# Patient Record
Sex: Male | Born: 1945 | Race: White | Hispanic: No | Marital: Married | Smoking: Former smoker
Health system: Southern US, Community
[De-identification: ages and names within clinical notes are randomized; demographics above are authoritative.]

## PROBLEM LIST (undated history)

## (undated) DIAGNOSIS — Z87442 Personal history of urinary calculi: Secondary | ICD-10-CM

## (undated) DIAGNOSIS — T8859XA Other complications of anesthesia, initial encounter: Secondary | ICD-10-CM

## (undated) DIAGNOSIS — R7303 Prediabetes: Secondary | ICD-10-CM

## (undated) DIAGNOSIS — R519 Headache, unspecified: Secondary | ICD-10-CM

## (undated) DIAGNOSIS — C801 Malignant (primary) neoplasm, unspecified: Secondary | ICD-10-CM

## (undated) DIAGNOSIS — E785 Hyperlipidemia, unspecified: Secondary | ICD-10-CM

## (undated) DIAGNOSIS — K219 Gastro-esophageal reflux disease without esophagitis: Secondary | ICD-10-CM

## (undated) DIAGNOSIS — M1990 Unspecified osteoarthritis, unspecified site: Secondary | ICD-10-CM

## (undated) DIAGNOSIS — I10 Essential (primary) hypertension: Secondary | ICD-10-CM

## (undated) HISTORY — PX: HERNIA REPAIR: SHX51

## (undated) HISTORY — PX: CHOLECYSTECTOMY: SHX55

## (undated) HISTORY — PX: EYE SURGERY: SHX253

## (undated) HISTORY — PX: DIAGNOSTIC LAPAROSCOPY: SUR761

---

## 2002-10-04 ENCOUNTER — Encounter: Payer: Self-pay | Admitting: Emergency Medicine

## 2002-10-04 ENCOUNTER — Emergency Department (HOSPITAL_COMMUNITY): Admission: EM | Admit: 2002-10-04 | Discharge: 2002-10-04 | Payer: Self-pay | Admitting: Emergency Medicine

## 2004-04-13 ENCOUNTER — Ambulatory Visit: Payer: Self-pay | Admitting: Gastroenterology

## 2005-03-13 ENCOUNTER — Ambulatory Visit: Payer: Self-pay | Admitting: Gastroenterology

## 2006-07-07 ENCOUNTER — Ambulatory Visit: Payer: Self-pay | Admitting: Internal Medicine

## 2007-03-09 ENCOUNTER — Ambulatory Visit: Payer: Self-pay | Admitting: Cardiovascular Disease

## 2008-03-11 HISTORY — PX: OTHER SURGICAL HISTORY: SHX169

## 2008-04-08 ENCOUNTER — Emergency Department: Payer: Self-pay | Admitting: Emergency Medicine

## 2008-12-26 ENCOUNTER — Encounter: Payer: Self-pay | Admitting: Family Medicine

## 2009-01-09 ENCOUNTER — Encounter: Payer: Self-pay | Admitting: Family Medicine

## 2009-02-08 ENCOUNTER — Encounter: Payer: Self-pay | Admitting: Family Medicine

## 2009-08-09 ENCOUNTER — Ambulatory Visit: Payer: Self-pay | Admitting: Internal Medicine

## 2009-09-08 ENCOUNTER — Ambulatory Visit: Payer: Self-pay | Admitting: Internal Medicine

## 2009-10-09 ENCOUNTER — Ambulatory Visit: Payer: Self-pay | Admitting: Internal Medicine

## 2009-11-09 ENCOUNTER — Ambulatory Visit: Payer: Self-pay | Admitting: Internal Medicine

## 2010-01-10 ENCOUNTER — Inpatient Hospital Stay: Payer: Self-pay | Admitting: Vascular Surgery

## 2010-01-16 LAB — PATHOLOGY REPORT

## 2010-05-15 ENCOUNTER — Ambulatory Visit: Payer: Self-pay | Admitting: Emergency Medicine

## 2012-02-15 ENCOUNTER — Emergency Department: Payer: Self-pay | Admitting: Emergency Medicine

## 2013-09-26 ENCOUNTER — Ambulatory Visit: Payer: Self-pay | Admitting: Physician Assistant

## 2013-11-17 ENCOUNTER — Ambulatory Visit: Payer: Self-pay | Admitting: Internal Medicine

## 2013-11-22 ENCOUNTER — Ambulatory Visit: Payer: Self-pay

## 2014-05-28 ENCOUNTER — Ambulatory Visit: Payer: Self-pay | Admitting: Vascular Surgery

## 2014-09-09 ENCOUNTER — Ambulatory Visit: Payer: 59 | Admitting: Certified Registered Nurse Anesthetist

## 2014-09-09 ENCOUNTER — Ambulatory Visit
Admission: RE | Admit: 2014-09-09 | Discharge: 2014-09-09 | Disposition: A | Discharge status: Home / Self Care | Payer: 59 | Source: Ambulatory Visit | Attending: Gastroenterology | Admitting: Gastroenterology

## 2014-09-09 ENCOUNTER — Encounter: Payer: Self-pay | Admitting: *Deleted

## 2014-09-09 ENCOUNTER — Encounter
Admission: RE | Disposition: A | Discharge status: Home / Self Care | Payer: Self-pay | Source: Ambulatory Visit | Attending: Gastroenterology

## 2014-09-09 DIAGNOSIS — I10 Essential (primary) hypertension: Secondary | ICD-10-CM | POA: Insufficient documentation

## 2014-09-09 DIAGNOSIS — Z87891 Personal history of nicotine dependence: Secondary | ICD-10-CM | POA: Insufficient documentation

## 2014-09-09 DIAGNOSIS — D120 Benign neoplasm of cecum: Secondary | ICD-10-CM | POA: Insufficient documentation

## 2014-09-09 DIAGNOSIS — Z86010 Personal history of colonic polyps: Secondary | ICD-10-CM | POA: Insufficient documentation

## 2014-09-09 DIAGNOSIS — Z1211 Encounter for screening for malignant neoplasm of colon: Secondary | ICD-10-CM | POA: Insufficient documentation

## 2014-09-09 DIAGNOSIS — Z79899 Other long term (current) drug therapy: Secondary | ICD-10-CM | POA: Insufficient documentation

## 2014-09-09 HISTORY — DX: Essential (primary) hypertension: I10

## 2014-09-09 HISTORY — DX: Malignant (primary) neoplasm, unspecified: C80.1

## 2014-09-09 HISTORY — PX: COLONOSCOPY: SHX5424

## 2014-09-09 SURGERY — COLONOSCOPY
Anesthesia: General

## 2014-09-09 MED ORDER — SODIUM CHLORIDE 0.9 % IV SOLN
INTRAVENOUS | Status: DC
  Administered 2014-09-09: 08:00:00 via INTRAVENOUS

## 2014-09-09 MED ORDER — SODIUM CHLORIDE 0.9 % IV SOLN: INTRAVENOUS | Status: DC

## 2014-09-09 MED ORDER — PROPOFOL 10 MG/ML IV BOLUS
INTRAVENOUS | Status: DC | PRN
  Administered 2014-09-09: 40 mg via INTRAVENOUS
  Administered 2014-09-09: 30 mg via INTRAVENOUS

## 2014-09-09 MED ORDER — LIDOCAINE HCL (CARDIAC) 20 MG/ML IV SOLN
INTRAVENOUS | Status: DC | PRN
  Administered 2014-09-09: 40 mg via INTRAVENOUS

## 2014-09-09 MED ORDER — PHENYLEPHRINE HCL 10 MG/ML IJ SOLN
INTRAMUSCULAR | Status: DC | PRN
  Administered 2014-09-09 (×4): 100 ug via INTRAVENOUS

## 2014-09-09 MED ORDER — PROPOFOL INFUSION 10 MG/ML OPTIME
INTRAVENOUS | Status: DC | PRN
  Administered 2014-09-09: 120 ug/kg/min via INTRAVENOUS

## 2014-09-09 MED ORDER — EPHEDRINE SULFATE 50 MG/ML IJ SOLN
INTRAMUSCULAR | Status: DC | PRN
  Administered 2014-09-09: 5 mg via INTRAVENOUS
  Administered 2014-09-09: 10 mg via INTRAVENOUS
  Administered 2014-09-09: 5 mg via INTRAVENOUS

## 2014-09-09 NOTE — Op Note (Addendum)
[facility] Patient Name: Brett Landry Procedure Date: 09/09/2014 8:06 AM MRN: [id number] Account #: [id number] Date of Birth: 09-10-1945 Admit Type: Outpatient Age: 69 Room: [facility] ENDO ROOM 4 Gender: Male Note Status: Supervisor Override Procedure:         Colonoscopy Indications:       Screening for colorectal malignant neoplasm Providers:         Lupita Dawn. Candace Cruise, MD Referring MD:      Forest Gleason Md, MD (Referring MD) Medicines:         Monitored Anesthesia Care Complications:     No immediate complications. Procedure:         Pre-Anesthesia Assessment:                    - Prior to the procedure, a History and Physical was                     performed, and patient medications, allergies and                     sensitivities were reviewed. The patient's tolerance of                     previous anesthesia was reviewed.                    - The risks and benefits of the procedure and the sedation                     options and risks were discussed with the patient. All                     questions were answered and informed consent was obtained.                    - After reviewing the risks and benefits, the patient was                     deemed in satisfactory condition to undergo the procedure.                    After obtaining informed consent, the colonoscope was                     passed under direct vision. Throughout the procedure, the                     patient's blood pressure, pulse, and oxygen saturations                     were monitored continuously. The Colonoscope was                     introduced through the anus and advanced to the the cecum,                     identified by appendiceal orifice and ileocecal valve. The                     colonoscopy was performed without difficulty. The patient                     tolerated the procedure well. Findings:      A medium polyp was found in the cecum. The polyp  was sessile. The  polyp       was removed with a hot snare. Resection and retrieval were complete. To       prevent bleeding, one hemoclip was placed.      The exam was otherwise without abnormality. Impression:        - One medium polyp in the cecum. Resected and retrieved.                    - The examination was otherwise normal. Recommendation:    - Discharge patient to home.                    - Await pathology results.                    - The findings and recommendations were discussed with the                     patient. Procedure Code(s): --- Professional ---                    [phone], Colonoscopy, flexible; with removal of tumor(s),                     polyp(s), or other lesion(s) by snare technique Diagnosis Code(s): --- Professional ---                    Z12.11, Encounter for screening for malignant neoplasm of                     colon                    D12.0, Benign neoplasm of cecum CPT copyright 2014 American Medical Association. All rights reserved. The codes documented in this report are preliminary and upon coder review may  be revised to meet current compliance requirements. Hulen Luster, MD 09/09/2014 8:31:22 AM This report has been signed electronically. Number of Addenda: 0 Note Initiated On: 09/09/2014 8:06 AM Scope Withdrawal Time: 0 hours 15 minutes 22 seconds  Total Procedure Duration: 0 hours 19 minutes 27 seconds       [facility]

## 2014-09-09 NOTE — Anesthesia Procedure Notes (Signed)
Performed by: Amayrany Cafaro Pre-anesthesia Checklist: Patient identified, Emergency Drugs available, Suction available, Patient being monitored and Timeout performed Patient Re-evaluated:Patient Re-evaluated prior to inductionOxygen Delivery Method: Nasal cannula Intubation Type: IV induction       

## 2014-09-09 NOTE — Anesthesia Postprocedure Evaluation (Signed)
  Anesthesia Post-op Note  Patient: Brett Landry  Procedure(s) Performed: Procedure(s): COLONOSCOPY (N/A)  Anesthesia type:General  Patient location: PACU  Post pain: Pain level controlled  Post assessment: Post-op Vital signs reviewed, Patient's Cardiovascular Status Stable, Respiratory Function Stable, Patent Airway and No signs of Nausea or vomiting  Post vital signs: Reviewed and stable  Last Vitals:  Filed Vitals:   09/09/14 0910  BP: 104/67  Pulse: 78  Temp:   Resp: 11    Level of consciousness: awake, alert  and patient cooperative  Complications: No apparent anesthesia complications

## 2014-09-09 NOTE — Transfer of Care (Signed)
Immediate Anesthesia Transfer of Care Note  Patient: Brett Landry  Procedure(s) Performed: Procedure(s): COLONOSCOPY (N/A)  Patient Location: PACU  Anesthesia Type:General  Level of Consciousness: awake, alert  and oriented  Airway & Oxygen Therapy: Patient Spontanous Breathing and Patient connected to nasal cannula oxygen  Post-op Assessment: Report given to RN and Post -op Vital signs reviewed and stable  Post vital signs: Reviewed and stable  Last Vitals:  Filed Vitals:   09/09/14 0836  BP: 99/64  Pulse: 84  Temp: 36 C  Resp: 20    Complications: No apparent anesthesia complications

## 2014-09-09 NOTE — Anesthesia Preprocedure Evaluation (Addendum)
Anesthesia Evaluation  Patient identified by MRN, date of birth, ID band Patient awake    Reviewed: Allergy & Precautions, NPO status , Patient's Chart, lab work & pertinent test results  Airway Mallampati: II  TM Distance: >3 FB Neck ROM: Full    Dental  (+) Chipped, Upper Dentures   Pulmonary former smoker,    Pulmonary exam normal       Cardiovascular hypertension, Normal cardiovascular exam    Neuro/Psych negative neurological ROS  negative psych ROS   GI/Hepatic Neg liver ROS, Hx of colon polyps   Endo/Other  negative endocrine ROS  Renal/GU negative Renal ROS  negative genitourinary   Musculoskeletal negative musculoskeletal ROS (+)   Abdominal Normal abdominal exam  (+)   Peds negative pediatric ROS (+)  Hematology negative hematology ROS (+)   Anesthesia Other Findings   Reproductive/Obstetrics                            Anesthesia Physical Anesthesia Plan  ASA: III  Anesthesia Plan: General   Post-op Pain Management:    Induction: Intravenous  Airway Management Planned: Nasal Cannula  Additional Equipment:   Intra-op Plan:   Post-operative Plan:   Informed Consent: I have reviewed the patients History and Physical, chart, labs and discussed the procedure including the risks, benefits and alternatives for the proposed anesthesia with the patient or authorized representative who has indicated his/her understanding and acceptance.   Dental advisory given  Plan Discussed with: CRNA and Surgeon  Anesthesia Plan Comments:         Anesthesia Quick Evaluation

## 2014-09-09 NOTE — H&P (Signed)
    Primary Care Physician:  Ronnie Doss, MD Primary Gastroenterologist:  Dr. Candace Cruise  Pre-Procedure History & Physical: HPI:  Brett Landry is a 69 y.o. male is here for an colonoscopy.  Past Medical History  Diagnosis Date  . Hypertension   . Cancer     Past Surgical History  Procedure Laterality Date  . Cholecystectomy    . Hernia repair    . Diagnostic laparoscopy      Prior to Admission medications   Medication Sig Start Date End Date Taking? Authorizing Provider  amLODipine (NORVASC) 5 MG tablet Take 5 mg by mouth daily.   Yes Historical Provider, MD  aspirin EC 81 MG tablet Take 81 mg by mouth daily.   Yes Historical Provider, MD  atorvastatin (LIPITOR) 20 MG tablet Take 20 mg by mouth daily.   Yes Historical Provider, MD  folic acid (FOLVITE) 1 MG tablet Take 1 mg by mouth daily.   Yes Historical Provider, MD  losartan-hydrochlorothiazide (HYZAAR) 100-25 MG per tablet Take 1 tablet by mouth daily.   Yes Historical Provider, MD  potassium chloride (MICRO-K) 10 MEQ CR capsule Take 10 mEq by mouth daily.   Yes Historical Provider, MD    Allergies as of 06/20/2014  . (Not on File)    History reviewed. No pertinent family history.  History   Social History  . Marital Status: Married    Spouse Name: N/A  . Number of Children: N/A  . Years of Education: N/A   Occupational History  . Not on file.   Social History Main Topics  . Smoking status: Former Research scientist (life sciences)  . Smokeless tobacco: Never Used  . Alcohol Use: No  . Drug Use: No  . Sexual Activity: Not on file   Other Topics Concern  . Not on file   Social History Narrative  . No narrative on file    Review of Systems: See HPI, otherwise negative ROS  Physical Exam: BP 101/63 mmHg  Pulse 82  Temp(Src) 96.4 F (35.8 C) (Tympanic)  Resp 18  Ht 5\' 11"  (1.803 m)  Wt 101.152 kg (223 lb)  BMI 31.12 kg/m2  SpO2 98% General:   Alert,  pleasant and cooperative in NAD Head:  Normocephalic and  atraumatic. Neck:  Supple; no masses or thyromegaly. Lungs:  Clear throughout to auscultation.    Heart:  Regular rate and rhythm. Abdomen:  Soft, nontender and nondistended. Normal bowel sounds, without guarding, and without rebound.   Neurologic:  Alert and  oriented x4;  grossly normal neurologically.  Impression/Plan: Brett Landry is here for an colonoscopy to be performed for screening.  Risks, benefits, limitations, and alternatives regarding colonoscopy have been reviewed with the patient.  Questions have been answered.  All parties agreeable.   Jatavis Malek, Lupita Dawn, MD  09/09/2014, 7:59 AM

## 2014-09-10 NOTE — Progress Notes (Signed)
Non-Identifying Voicemail.  No message left.

## 2014-09-13 ENCOUNTER — Encounter: Payer: Self-pay | Admitting: Gastroenterology

## 2014-09-13 LAB — SURGICAL PATHOLOGY

## 2014-11-10 ENCOUNTER — Ambulatory Visit
Admission: EM | Admit: 2014-11-10 | Discharge: 2014-11-10 | Disposition: A | Discharge status: Home / Self Care | Payer: 59 | Attending: Family Medicine | Admitting: Family Medicine

## 2014-11-10 ENCOUNTER — Encounter: Payer: Self-pay | Admitting: Emergency Medicine

## 2014-11-10 DIAGNOSIS — H9201 Otalgia, right ear: Secondary | ICD-10-CM

## 2014-11-10 DIAGNOSIS — H6093 Unspecified otitis externa, bilateral: Secondary | ICD-10-CM | POA: Diagnosis not present

## 2014-11-10 MED ORDER — CIPROFLOXACIN-DEXAMETHASONE 0.3-0.1 % OT SUSP: 4.0000 [drp] | Freq: Two times a day (BID) | OTIC | Status: AC

## 2014-11-10 NOTE — ED Provider Notes (Signed)
[facility] Emergency Department Provider Note  ____________________________________________  Time seen: Approximately 10:38 AM  I have reviewed the triage vital signs and the nursing notes.   HISTORY  Chief Complaint Otalgia   HPI Brett Landry is a 69 y.o. male presents for the complaint of right ear pain. Patient reports that intermittently he will have wax buildup in his ears. Patient reports 4 days with right ear discomfort at 3 out of 10 and feeling like his ear is clogged up and itches. Denies hearing changes. Denies decreased hearing. Denies fall or injury. Denies dizziness or headache. Patient does report that he frequently has been outside and sweats a lot and states that the sweat does get into his ear. Also reports swimming last week. Denies fevers. Denies neck pain. Denies other pain complaints. Denies other complaints. Reports continues to eat and drink well.  States he has a history of similar which occurred in the last summer and states that he had swimmer's ear. States that this feels like that.States followed with Dr Pryor Ochoa ENT in past.   Past Medical History  Diagnosis Date  . Hypertension   . Cancer   Seasonal allergies.   There are no active problems to display for this patient.   Past Surgical History  Procedure Laterality Date  . Cholecystectomy    . Hernia repair    . Diagnostic laparoscopy    . Colonoscopy N/A 09/09/2014    Procedure: COLONOSCOPY;  Surgeon: Hulen Luster, MD;  Location: [facility] ENDOSCOPY;  Service: Gastroenterology;  Laterality: N/A;    Current Outpatient Rx  Name  Route  Sig  Dispense  Refill  . amLODipine (NORVASC) 5 MG tablet   Oral   Take 5 mg by mouth daily.         Marland Kitchen aspirin EC 81 MG tablet   Oral   Take 81 mg by mouth daily.         Marland Kitchen atorvastatin (LIPITOR) 20 MG tablet   Oral   Take 20 mg by mouth daily.         . folic acid (FOLVITE) 1 MG tablet   Oral   Take 1 mg by mouth daily.          Marland Kitchen losartan-hydrochlorothiazide (HYZAAR) 100-25 MG per tablet   Oral   Take 1 tablet by mouth daily.         . potassium chloride (MICRO-K) 10 MEQ CR capsule   Oral   Take 10 mEq by mouth daily.           Allergies Iodine solution  History reviewed. No pertinent family history.  Social History Social History  Substance Use Topics  . Smoking status: Former Research scientist (life sciences)  . Smokeless tobacco: Never Used  . Alcohol Use: No    Review of Systems Constitutional: No fever/chills Eyes: No visual changes. ENT: No sore throat. Right ear discomfort as above.  Cardiovascular: Denies chest pain. Respiratory: Denies shortness of breath. Gastrointestinal: No abdominal pain.  No nausea, no vomiting.  No diarrhea.  No constipation. Genitourinary: Negative for dysuria. Musculoskeletal: Negative for back pain. Skin: Negative for rash. Neurological: Negative for headaches, focal weakness or numbness.  10-point ROS otherwise negative.  ____________________________________________   PHYSICAL EXAM:  VITAL SIGNS: ED Triage Vitals  Enc Vitals Group     BP 11/10/14 0959 122/64 mmHg     Pulse Rate 11/10/14 0959 50 Recheck 62     Resp 11/10/14 0959 18     Temp 11/10/14  0959 97.8 F (36.6 C)     Temp Source 11/10/14 0959 Tympanic     SpO2 11/10/14 0959 98 %     Weight 11/10/14 0959 228 lb (103.42 kg)     Height 11/10/14 0959 5\' 11"  (1.803 m)     Head Cir --      Peak Flow --      Pain Score 11/10/14 1004 9     Pain Loc --      Pain Edu? --      Excl. in [place]? --     Constitutional: Alert and oriented. Well appearing and in no acute distress. Eyes: Conjunctivae are normal. PERRL. EOMI. Head: Atraumatic.Nontender.   Ears: NO external erythema, swelling. Bilateral canals with swelling and whitish exudate discharge. Left ear  nontender with minimal discharge. Right ear mild TTP with auricle movement, mod amount discharge. Left TM no  erythema. Unable to visualize right TM. Hearing  grossly intact.   Nose: No congestion/rhinnorhea.  Mouth/Throat: Mucous membranes are moist.  Oropharynx non-erythematous. Neck: No stridor.  No cervical spine tenderness to palpation. Hematological/Lymphatic/Immunilogical: No cervical lymphadenopathy. Cardiovascular: Normal rate, regular rhythm. Grossly normal heart sounds.  Good peripheral circulation. Respiratory: Normal respiratory effort.  No retractions. Lungs CTAB. Gastrointestinal: Soft and nontender. No distention. Normal Bowel sounds.   Neurologic:  Normal speech and language. No gross focal neurologic deficits are appreciated. No gait instability. Skin:  Skin is warm, dry and intact. No rash noted. Psychiatric: Mood and affect are normal. Speech and behavior are normal.  ____________________________________________   LABS (all labs ordered are listed, but only abnormal results are displayed)  Labs Reviewed - No data to display ____________________________________________  PROCEDURES  Procedure(s) performed:   Bilateral ears with whitish exudate and discharge present in canal. Ear curette used to remove cerumen and discharge from bilateral ears. Patient tolerate well. Reports improved discomfort post removal.  ____________________________________________   INITIAL IMPRESSION / ASSESSMENT AND PLAN / ED COURSE  Pertinent labs & imaging results that were available during my care of the patient were reviewed by me and considered in my medical decision making (see chart for details).  Well appearing. No acute distress. Presents for right ear discomfort. Bilateral ears otitis externa, likely due to recent sweating and swimming. Exudate removed with curette. Reports feeling improved post removal. Will treat with topical ciprodex. Counseled regarding keeping dry and avoid submerging in water. Follow up with PCP or ENT ([facility] as previously followed with) next week. Discussed follow up and return parameters. Patient verbalized  understanding and agreed to plan.  ____________________________________________   FINAL CLINICAL IMPRESSION(S) / ED DIAGNOSES  Final diagnoses:  Otitis externa, bilateral  Otalgia of right ear       Marylene Land, NP 11/10/14 [place], NP 11/10/14 1127

## 2014-11-10 NOTE — Discharge Instructions (Signed)
Use medication as prescribed. Avoid submerging ears in water.   Follow up with your primary care physician or Dr Pryor Ochoa ENT as needed.   Return to Urgent care for new or worsening concerns.   Otitis Externa Otitis externa is a bacterial or fungal infection of the outer ear canal. This is the area from the eardrum to the outside of the ear. Otitis externa is sometimes called "swimmer's ear." CAUSES  Possible causes of infection include:  Swimming in dirty water.  Moisture remaining in the ear after swimming or bathing.  Mild injury (trauma) to the ear.  Objects stuck in the ear (foreign body).  Cuts or scrapes (abrasions) on the outside of the ear. SIGNS AND SYMPTOMS  The first symptom of infection is often itching in the ear canal. Later signs and symptoms may include swelling and redness of the ear canal, ear pain, and yellowish-white fluid (pus) coming from the ear. The ear pain may be worse when pulling on the earlobe. DIAGNOSIS  Your health care provider will perform a physical exam. A sample of fluid may be taken from the ear and examined for bacteria or fungi. TREATMENT  Antibiotic ear drops are often given for 10 to 14 days. Treatment may also include pain medicine or corticosteroids to reduce itching and swelling. HOME CARE INSTRUCTIONS   Apply antibiotic ear drops to the ear canal as prescribed by your health care provider.  Take medicines only as directed by your health care provider.  If you have diabetes, follow any additional treatment instructions from your health care provider.  Keep all follow-up visits as directed by your health care provider. PREVENTION   Keep your ear dry. Use the corner of a towel to absorb water out of the ear canal after swimming or bathing.  Avoid scratching or putting objects inside your ear. This can damage the ear canal or remove the protective wax that lines the canal. This makes it easier for bacteria and fungi to grow.  Avoid  swimming in lakes, polluted water, or poorly chlorinated pools.  You may use ear drops made of rubbing alcohol and vinegar after swimming. Combine equal parts of white vinegar and alcohol in a bottle. Put 3 or 4 drops into each ear after swimming. SEEK MEDICAL CARE IF:   You have a fever.  Your ear is still red, swollen, painful, or draining pus after 3 days.  Your redness, swelling, or pain gets worse.  You have a severe headache.  You have redness, swelling, pain, or tenderness in the area behind your ear. MAKE SURE YOU:   Understand these instructions.  Will watch your condition.  Will get help right away if you are not doing well or get worse. Document Released: 02/25/2005 Document Revised: 07/12/2013 Document Reviewed: 03/14/2011 [facility] Patient Information 2015 [place], [place]. This information is not intended to replace advice given to you by your health care provider. Make sure you discuss any questions you have with your health care provider.

## 2014-11-10 NOTE — ED Notes (Signed)
Pt with pain right ear

## 2021-06-11 ENCOUNTER — Other Ambulatory Visit: Payer: Self-pay | Admitting: Neurosurgery

## 2021-06-12 ENCOUNTER — Other Ambulatory Visit: Payer: Self-pay | Admitting: Neurosurgery

## 2021-06-14 NOTE — Pre-Procedure Instructions (Signed)
Surgical Instructions ? ? ? Your procedure is scheduled on Tuesday, June 26, 2021 at 10:15 AM. ? Report to [facility] Main Entrance "A" at 8:15 A.M., then check in with the Admitting office. ? Call this number if you have problems the morning of surgery: ? [phone] ? ? If you have any questions prior to your surgery date call [phone]: Open Monday-Friday 8am-4pm ? ? ? Remember: ? Do not eat or drink after midnight the night before your surgery ?  ? Take these medicines the morning of surgery with A SIP OF WATER: ? ?amLODipine (NORVASC) ?atorvastatin (LIPITOR) ?Lifitegrast [place] Friar) ? ?IF NEEDED: ?carboxymethylcellulose (REFRESH PLUS) ?fluticasone (FLONASE) ? ?Follow your surgeon's instructions on when to stop Aspirin.  If no instructions were given by your surgeon then you will need to call the office to get those instructions.   ? ? ?As of today, STOP taking any Aleve, Naproxen, Ibuprofen, Motrin, Advil, Goody's, BC's, all herbal medications, fish oil, and all vitamins. ?         ?           ?Do NOT Smoke (Tobacco/Vaping) for 24 hours prior to your procedure. ? ?If you use a CPAP at night, you may bring your mask/headgear for your overnight stay. ?  ?Contacts, glasses, piercing's, hearing aid's, dentures or partials may not be worn into surgery, please bring cases for these belongings.  ?  ?For patients admitted to the hospital, discharge time will be determined by your treatment team. ?  ?Patients discharged the day of surgery will not be allowed to drive home, and someone needs to stay with them for 24 hours. ? ?SURGICAL WAITING ROOM VISITATION ?Patients having surgery or a procedure may have two support people in the waiting area. ?Visitors may stay in the waiting area during the procedure and switch out with other visitors if needed. ?Children under the age of 58 must have an adult accompany them who is not the patient. ?If the patient needs to stay at the hospital during part of their recovery, the  visitor guidelines for inpatient rooms apply. ? ?Please refer to the [facility] website for the visitor guidelines for Inpatients (after your surgery is over and you are in a regular room).  ? ? ?Special instructions:   ?- Preparing For Surgery ? ?Before surgery, you can play an important role. Because skin is not sterile, your skin needs to be as free of germs as possible. You can reduce the number of germs on your skin by washing with CHG (chlorahexidine gluconate) Soap before surgery.  CHG is an antiseptic cleaner which kills germs and bonds with the skin to continue killing germs even after washing.   ? ?Oral Hygiene is also important to reduce your risk of infection.  Remember - BRUSH YOUR TEETH THE MORNING OF SURGERY WITH YOUR REGULAR TOOTHPASTE ? ?Please do not use if you have an allergy to CHG or antibacterial soaps. If your skin becomes reddened/irritated stop using the CHG.  ?Do not shave (including legs and underarms) for at least 48 hours prior to first CHG shower. It is OK to shave your face. ? ?Please follow these instructions carefully. ?  ?Shower the NIGHT BEFORE SURGERY and the MORNING OF SURGERY ? ?If you chose to wash your hair, wash your hair first as usual with your normal shampoo. ? ?After you shampoo, rinse your hair and body thoroughly to remove the shampoo. ? ?Use CHG Soap as you would any other liquid soap. You  can apply CHG directly to the skin and wash gently with a scrungie or a clean washcloth.  ? ?Apply the CHG Soap to your body ONLY FROM THE NECK DOWN.  Do not use on open wounds or open sores. Avoid contact with your eyes, ears, mouth and genitals (private parts). Wash Face and genitals (private parts)  with your normal soap.  ? ?Wash thoroughly, paying special attention to the area where your surgery will be performed. ? ?Thoroughly rinse your body with warm water from the neck down. ? ?DO NOT shower/wash with your normal soap after using and rinsing off the CHG  Soap. ? ?Pat yourself dry with a CLEAN TOWEL. ? ?Wear CLEAN PAJAMAS to bed the night before surgery ? ?Place CLEAN SHEETS on your bed the night before your surgery ? ?DO NOT SLEEP WITH PETS. ? ? ?Day of Surgery: ?Take a shower with CHG soap. ?Do not wear jewelry. ?Do not wear lotions, powders, colognes, or deodorant. ?Do not shave 48 hours prior to surgery.  Men may shave face and neck. ?Do not bring valuables to the hospital.  ?[facility] is not responsible for any belongings or valuables. ?Wear Clean/Comfortable clothing the morning of surgery ?Do not apply any deodorants/lotions.   ?Remember to brush your teeth WITH YOUR REGULAR TOOTHPASTE. ?  ?Please read over the following fact sheets that you were given. ? ?If you received a COVID test during your pre-op visit  it is requested that you wear a mask when out in public, stay away from anyone that may not be feeling well and notify your surgeon if you develop symptoms. If you have been in contact with anyone that has tested positive in the last 10 days please notify you surgeon. ?  ? ?

## 2021-06-15 ENCOUNTER — Encounter (HOSPITAL_COMMUNITY)
Admission: RE | Admit: 2021-06-15 | Discharge: 2021-06-15 | Disposition: A | Discharge status: Home / Self Care | Payer: No Typology Code available for payment source | Source: Ambulatory Visit | Attending: Neurosurgery | Admitting: Neurosurgery

## 2021-06-15 ENCOUNTER — Other Ambulatory Visit: Payer: Self-pay

## 2021-06-15 ENCOUNTER — Encounter (HOSPITAL_COMMUNITY): Payer: Self-pay

## 2021-06-15 VITALS — BP 131/79 | HR 78 | Temp 97.6°F | Resp 18 | Ht 71.0 in | Wt 219.9 lb

## 2021-06-15 DIAGNOSIS — I471 Supraventricular tachycardia: Secondary | ICD-10-CM | POA: Insufficient documentation

## 2021-06-15 DIAGNOSIS — Z87891 Personal history of nicotine dependence: Secondary | ICD-10-CM | POA: Insufficient documentation

## 2021-06-15 DIAGNOSIS — G9520 Unspecified cord compression: Secondary | ICD-10-CM | POA: Insufficient documentation

## 2021-06-15 DIAGNOSIS — I10 Essential (primary) hypertension: Secondary | ICD-10-CM | POA: Insufficient documentation

## 2021-06-15 DIAGNOSIS — I472 Ventricular tachycardia, unspecified: Secondary | ICD-10-CM | POA: Insufficient documentation

## 2021-06-15 DIAGNOSIS — K219 Gastro-esophageal reflux disease without esophagitis: Secondary | ICD-10-CM | POA: Diagnosis not present

## 2021-06-15 DIAGNOSIS — I493 Ventricular premature depolarization: Secondary | ICD-10-CM | POA: Diagnosis not present

## 2021-06-15 DIAGNOSIS — E785 Hyperlipidemia, unspecified: Secondary | ICD-10-CM | POA: Insufficient documentation

## 2021-06-15 DIAGNOSIS — R7303 Prediabetes: Secondary | ICD-10-CM | POA: Insufficient documentation

## 2021-06-15 DIAGNOSIS — Z01818 Encounter for other preprocedural examination: Secondary | ICD-10-CM | POA: Diagnosis not present

## 2021-06-15 HISTORY — DX: Prediabetes: R73.03

## 2021-06-15 HISTORY — DX: Unspecified osteoarthritis, unspecified site: M19.90

## 2021-06-15 HISTORY — DX: Other complications of anesthesia, initial encounter: T88.59XA

## 2021-06-15 HISTORY — DX: Headache, unspecified: R51.9

## 2021-06-15 HISTORY — DX: Gastro-esophageal reflux disease without esophagitis: K21.9

## 2021-06-15 HISTORY — DX: Hyperlipidemia, unspecified: E78.5

## 2021-06-15 HISTORY — DX: Personal history of urinary calculi: Z87.442

## 2021-06-15 LAB — TYPE AND SCREEN
ABO/RH(D): A POS
Antibody Screen: NEGATIVE

## 2021-06-15 LAB — CBC
HCT: 41.5 % (ref 39.0–52.0)
Hemoglobin: 13.3 g/dL (ref 13.0–17.0)
MCH: 28.9 pg (ref 26.0–34.0)
MCHC: 32 g/dL (ref 30.0–36.0)
MCV: 90 fL (ref 80.0–100.0)
Platelets: 250 10*3/uL (ref 150–400)
RBC: 4.61 MIL/uL (ref 4.22–5.81)
RDW: 13.1 % (ref 11.5–15.5)
WBC: 7.4 10*3/uL (ref 4.0–10.5)
nRBC: 0 % (ref 0.0–0.2)

## 2021-06-15 LAB — BASIC METABOLIC PANEL
Anion gap: 6 (ref 5–15)
BUN: 13 mg/dL (ref 8–23)
CO2: 28 mmol/L (ref 22–32)
Calcium: 8.7 mg/dL — ABNORMAL LOW (ref 8.9–10.3)
Chloride: 107 mmol/L (ref 98–111)
Creatinine, Ser: 0.8 mg/dL (ref 0.61–1.24)
GFR, Estimated: >60 mL/min (ref >60)
Glucose, Bld: 140 mg/dL — ABNORMAL HIGH (ref 70–99)
Potassium: 4.1 mmol/L (ref 3.5–5.1)
Sodium: 141 mmol/L (ref 135–145)

## 2021-06-15 LAB — SURGICAL PCR SCREEN
MRSA, PCR: NEGATIVE
Staphylococcus aureus: POSITIVE — AB

## 2021-06-15 NOTE — Progress Notes (Signed)
DUE TO COVID-19 ONLY TWO VISITORS ARE ALLOWED TO COME WITH YOU AND STAY IN THE SURGICAL WAITING ROOM ONLY DURING PRE OP AND PROCEDURE DAY OF SURGERY.  ? ?Two VISITORS MAY VISIT WITH YOU AFTER SURGERY IN YOUR PRIVATE ROOM DURING VISITING HOURS ONLY! ? ?PCP - [facility] Dr Landry Mellow ?Cardiologist - n/a ? ?Chest x-ray - n/a ?EKG - 06/15/21 ?Stress Test - n/a ?ECHO - n/a ?Cardiac Cath - n/a ? ?ICD Pacemaker/Loop - n/a ? ?Sleep Study -  n/a ?CPAP - none ? ?Aspirin Instructions: Follow your surgeon's instructions on when to stop aspirin prior to surgery,  If no instructions were given by your surgeon then you will need to call the office for those instructions. ? ?Anesthesia review: Yes ? ?Coronavirus Screening ?Do you have any of the following symptoms:  ?Cough yes/no: No ?Fever (>100.98F)  yes/no: No ?Runny nose yes/no: No ?Sore throat yes/no: No ?Difficulty breathing/shortness of breath  yes/no: No ? ?Have you traveled in the last 14 days and where? yes/no: No ? ?Patient verbalized understanding of instructions that were given to them at the PAT appointment. Patient was also instructed that they will need to review over the PAT instructions again at home before surgery. ? ?

## 2021-06-18 NOTE — Anesthesia Preprocedure Evaluation (Addendum)
Anesthesia Evaluation  ?Patient identified by MRN, date of birth, ID band ?Patient awake ? ? ? ?Reviewed: ?Allergy & Precautions, NPO status  ? ?Airway ?Mallampati: II ? ?TM Distance: >3 FB ? ? ? ? Dental ?  ?Pulmonary ?neg pulmonary ROS, former smoker,  ?  ?breath sounds clear to auscultation ? ? ? ? ? ? Cardiovascular ?hypertension,  ?Rhythm:Regular Rate:Normal ? ? ?  ?Neuro/Psych ? Headaches,   ? GI/Hepatic ?Neg liver ROS, GERD  ,  ?Endo/Other  ?negative endocrine ROS ? Renal/GU ?negative Renal ROS  ? ?  ?Musculoskeletal ? ?(+) Arthritis ,  ? Abdominal ?  ?Peds ? Hematology ?  ?Anesthesia Other Findings ? ? Reproductive/Obstetrics ? ?  ? ? ? ? ? ? ? ? ? ? ? ? ? ?  ?  ? ? ? ? ? ? ? ?Anesthesia Physical ?Anesthesia Plan ? ?ASA: 3 ? ?Anesthesia Plan: General  ? ?Post-op Pain Management:   ? ?Induction: Intravenous ? ?PONV Risk Score and Plan: 3 and Ondansetron and Dexamethasone ? ?Airway Management Planned: Oral ETT ? ?Additional Equipment:  ? ?Intra-op Plan:  ? ?Post-operative Plan: Possible Post-op intubation/ventilation ? ?Informed Consent: I have reviewed the patients History and Physical, chart, labs and discussed the procedure including the risks, benefits and alternatives for the proposed anesthesia with the patient or authorized representative who has indicated his/her understanding and acceptance.  ? ? ? ?Dental advisory given ? ?Plan Discussed with: CRNA and Anesthesiologist ? ?Anesthesia Plan Comments: (PAT note written by Myra Gianotti, PA-C. ?)  ? ? ? ? ?Anesthesia Quick Evaluation ? ?

## 2021-06-18 NOTE — Progress Notes (Addendum)
Anesthesia Chart Review: ? Case: [id number] Date/Time: 06/26/21 1000  ? Procedure: ACDF C56 - 3C  ? Anesthesia type: General  ? Pre-op diagnosis: CERVICAL CORD COMPRESSION WITH MYELOPATHY  ? Location: [facility] ROOM 20 / [facility] OR  ? Surgeons: Vallarie Mare, MD  ? ?  ? ? ?DISCUSSION: Patient is a 76 year old male scheduled for the above procedure. ? ?History includes former smoker (03/11/88), HTN, HLD, pre-diabetes, GERD, prostate cancer (s/p robotic assisted laparoscopic prostatectomy and bilateral pelvic lymph node dissection 07/05/2008, [facility]). He reported hypotension with 2010 prostatectomy and with cataract surgery. 06/2008 Discharge Summary indicated that patient was hypotensive with SBPs in the 80's-90's with low UOP in the PACU and also on POD#1. He was treated with IVF. Also noted HCT dropped from 35 to 27 post-operatively, but no mention if he received PRBC. BP stabilized and HCT up to 30 by discharge. Pre and post procedure BP with 09/09/14 colonoscopy was 101/62 and 104/67, respectively. ? ?He recently had a Holter monitor through the [facility]. Appears there may have initially been a question of atrial flutter, but results showed baseline SR, no prolonged pauses or high grade AV block, very frequent PACs, occasional PVCs, 12 NSVT runs (longest 6 beats), 417 PSVT runs (longest 20 beasts). He also had a CXR and EKG as part of his work-up. EKG was not received, but CXR showed no acute cardiopulmonary process. Subsequently patient was cleared for surgery (according to [place], RN note on 06/07/21 ). Requested Dr. Marcello Moores' staff fax a copy of the surgical clearance received. 06/15/21 EKG showed SR with marked sinus arrhythmia.  ? ? ?ADDENDUM 06/19/21 2:43 PM: ?Dr. Landry Mellow classified patient as "Moderate Risk" for planned procedure with permission to hold ASA for 5 days prior to surgery.  05/07/2021 EKG showed sinus rhythm with marked sinus arrhythmia with occasional PVCs. ? ?Anesthesia team to  evaluate on the day of surgery. ? ? ?VS: BP 131/79   Pulse 78   Temp 36.4 ?C (Oral)   Resp 18   Ht '5\' 11"'$  (1.803 m)   Wt 99.7 kg   SpO2 98%   BMI 30.67 kg/m?  ? ? ?PROVIDERS: ?[place], Rudi Rummage, MD is PCP  ? ? ?LABS: Labs reviewed: Acceptable for surgery. A1c 7.1% on 05/07/21 [facility]).  ?(all labs ordered are listed, but only abnormal results are displayed) ? ?Labs Reviewed  ?SURGICAL PCR SCREEN - Abnormal; Notable for the following components:  ?    Result Value  ? Staphylococcus aureus POSITIVE (*)   ? All other components within normal limits  ?BASIC METABOLIC PANEL - Abnormal; Notable for the following components:  ? Glucose, Bld 140 (*)   ? Calcium 8.7 (*)   ? All other components within normal limits  ?CBC  ?TYPE AND SCREEN  ? ? ?IMAGES: ?CXR 05/07/21 [facility]):  ?Findings:  ?Frontal and lateral views the chest.  ?The cardiac silhouette is within normal limits.  ?The lungs are well-inflated.  ?There is stable mild scarring in the left lung base.  ?The lungs otherwise appear clear. A subtle opacity over the right  ?lung base has been previously shown to represent nipple shadow.  ?There are no pleural effusions identified. There is no evident  ?pneumothorax.  ? ?Impression: ?1. Stable appearance of the chest with no acute cardiopulmonary  ?process identified.  ? ? ?EKG: 06/15/21:  ?Sinus rhythm with marked sinus arrhythmia ?Low voltage QRS ?Borderline ECG ?When compared with ECG of 09-Sep-2014 07:46, ?PREVIOUS ECG  IS PRESENT ?No significant change since last tracing ?Confirmed by Carlyle Dolly [phone]) on 06/16/2021 3:32:26 PM ? ? ?CV: ?14 Day Holter Monitor 05/10/21 [facility]):  ?Heart Rate ?overall max 174bpm (overall), 130bpm (sinus) ?min 48bpm ?avg 77bpm ? ?Ectopics: ?supraventricular Ectopy ?isolated freq 16.7% ?couplet freq 5.8% ?triplet occ 2.1% ? ?ventricular ectopy ?isolated occ 2.3%, ?couplet rare <1%, ?triplet rare <1% ? ?Arrhythmia episodes: very freq PACs, occ PVCs; 12 NSVT runs (longest was 6   ?beats); 417 PSVT runs (longest was 20s) ? ?Patient triggered event correlated with the presence of PACs ? ?Impression: ?Baseline rhythm is sinus ?Normal heart rate variability and no prolonged pauses or high grade AV block ?very freq PACs and occ PVCs ?12 NSVT runs (longest was 6 beats) ?417 PSVT runs (longest was 20s, pAT) ?Patient triggered event corresponded to the presence of PACs ? ?Please correlate clinically. ?/es/ Herschel Senegal, MD ?CARDIAC ELECTROPHYSIOLOGY ?Signed: 06/05/2021 09:29 ? ? ?Past Medical History:  ?Diagnosis Date  ? Arthritis   ? Cancer [facility])   ? Prostate CA  ? Complication of anesthesia   ? BP blood "really low" per patient with prostate and cataract surgery  ? GERD (gastroesophageal reflux disease)   ? hx in past, per patient "no problems now", no meds  ? Headache   ? History of kidney stones   ? passed stones  ? HLD (hyperlipidemia)   ? tx w/Lipitor  ? Hypertension   ? Pre-diabetes   ? diet controlled, no meds  ? ? ?Past Surgical History:  ?Procedure Laterality Date  ? CHOLECYSTECTOMY    ? COLONOSCOPY N/A 09/09/2014  ? Procedure: COLONOSCOPY;  Surgeon: Hulen Luster, MD;  Location: [facility] ENDOSCOPY;  Service: Gastroenterology;  Laterality: N/A;  ? EYE SURGERY Bilateral   ? remove cataracts  ? HERNIA REPAIR    ? Laparoscopic Robotic Removal of Prostate Gland  2010  ? at [facility]  ? ? ?MEDICATIONS: ? amLODipine (NORVASC) 5 MG tablet  ? aspirin EC 81 MG tablet  ? atorvastatin (LIPITOR) 20 MG tablet  ? carboxymethylcellulose (REFRESH PLUS) 0.5 % SOLN  ? Coenzyme Q10 (CO Q-10 PO)  ? desonide (DESOWEN) 0.05 % cream  ? fluticasone (FLONASE) 50 MCG/ACT nasal spray  ? Lifitegrast (XIIDRA) 5 % SOLN  ? mometasone (ELOCON) 0.1 % cream  ? mometasone (ELOCON) 0.1 % lotion  ? Omega-3 Fatty Acids (FISH OIL PO)  ? ?No current facility-administered medications for this encounter.  ? ?Patient advised to follow surgeon instructions regarding perioperative ASA. ? ? ?Myra Gianotti, PA-C ?Surgical Short  Stay/Anesthesiology ?[facility] Phone [phone] ?[facility] Phone [phone] ?06/18/2021 2:21 PM ? ? ? ? ? ? ? ?

## 2021-06-26 ENCOUNTER — Other Ambulatory Visit: Payer: Self-pay

## 2021-06-26 ENCOUNTER — Ambulatory Visit (HOSPITAL_BASED_OUTPATIENT_CLINIC_OR_DEPARTMENT_OTHER): Payer: No Typology Code available for payment source | Admitting: Certified Registered Nurse Anesthetist

## 2021-06-26 ENCOUNTER — Ambulatory Visit (HOSPITAL_COMMUNITY): Payer: No Typology Code available for payment source

## 2021-06-26 ENCOUNTER — Encounter (HOSPITAL_COMMUNITY): Payer: Self-pay

## 2021-06-26 ENCOUNTER — Ambulatory Visit (HOSPITAL_COMMUNITY): Payer: No Typology Code available for payment source | Admitting: Vascular Surgery

## 2021-06-26 ENCOUNTER — Encounter (HOSPITAL_COMMUNITY)
Admission: RE | Disposition: A | Discharge status: Home / Self Care | Payer: Self-pay | Source: Home / Self Care | Attending: Neurosurgery

## 2021-06-26 ENCOUNTER — Ambulatory Visit (HOSPITAL_COMMUNITY)
Admission: RE | Admit: 2021-06-26 | Discharge: 2021-06-26 | Disposition: A | Discharge status: Home / Self Care | Payer: No Typology Code available for payment source | Attending: Neurosurgery | Admitting: Neurosurgery

## 2021-06-26 DIAGNOSIS — M2578 Osteophyte, vertebrae: Secondary | ICD-10-CM | POA: Diagnosis not present

## 2021-06-26 DIAGNOSIS — M1990 Unspecified osteoarthritis, unspecified site: Secondary | ICD-10-CM | POA: Diagnosis not present

## 2021-06-26 DIAGNOSIS — Z87891 Personal history of nicotine dependence: Secondary | ICD-10-CM | POA: Insufficient documentation

## 2021-06-26 DIAGNOSIS — M4802 Spinal stenosis, cervical region: Secondary | ICD-10-CM | POA: Diagnosis present

## 2021-06-26 DIAGNOSIS — I10 Essential (primary) hypertension: Secondary | ICD-10-CM

## 2021-06-26 DIAGNOSIS — G992 Myelopathy in diseases classified elsewhere: Secondary | ICD-10-CM | POA: Insufficient documentation

## 2021-06-26 DIAGNOSIS — G959 Disease of spinal cord, unspecified: Secondary | ICD-10-CM | POA: Diagnosis not present

## 2021-06-26 HISTORY — PX: ANTERIOR CERVICAL DECOMP/DISCECTOMY FUSION: SHX1161

## 2021-06-26 LAB — ABO/RH: ABO/RH(D): A POS

## 2021-06-26 SURGERY — ANTERIOR CERVICAL DECOMPRESSION/DISCECTOMY FUSION 1 LEVEL
Anesthesia: General

## 2021-06-26 MED ORDER — THROMBIN 5000 UNITS EX SOLR
CUTANEOUS | Status: AC
  Filled 2021-06-26: qty 5000

## 2021-06-26 MED ORDER — LIDOCAINE 2% (20 MG/ML) 5 ML SYRINGE
INTRAMUSCULAR | Status: AC
  Filled 2021-06-26: qty 5

## 2021-06-26 MED ORDER — DEXAMETHASONE SODIUM PHOSPHATE 10 MG/ML IJ SOLN
INTRAMUSCULAR | Status: AC
  Filled 2021-06-26: qty 1

## 2021-06-26 MED ORDER — DEXAMETHASONE SODIUM PHOSPHATE 10 MG/ML IJ SOLN
INTRAMUSCULAR | Status: DC | PRN
  Administered 2021-06-26: 10 mg via INTRAVENOUS

## 2021-06-26 MED ORDER — FENTANYL CITRATE (PF) 100 MCG/2ML IJ SOLN
INTRAMUSCULAR | Status: DC | PRN
  Administered 2021-06-26 (×5): 50 ug via INTRAVENOUS

## 2021-06-26 MED ORDER — CEFAZOLIN SODIUM-DEXTROSE 2-4 GM/100ML-% IV SOLN
2.0000 g | INTRAVENOUS | Status: AC
  Administered 2021-06-26: 2 g via INTRAVENOUS

## 2021-06-26 MED ORDER — SUGAMMADEX SODIUM 200 MG/2ML IV SOLN
INTRAVENOUS | Status: DC | PRN
  Administered 2021-06-26: 200 mg via INTRAVENOUS

## 2021-06-26 MED ORDER — CEFAZOLIN SODIUM-DEXTROSE 2-4 GM/100ML-% IV SOLN
INTRAVENOUS | Status: AC
  Filled 2021-06-26: qty 100

## 2021-06-26 MED ORDER — ORAL CARE MOUTH RINSE: 15.0000 mL | Freq: Once | OROMUCOSAL | Status: AC

## 2021-06-26 MED ORDER — PROPOFOL 10 MG/ML IV BOLUS
INTRAVENOUS | Status: DC | PRN
  Administered 2021-06-26: 130 mg via INTRAVENOUS

## 2021-06-26 MED ORDER — ONDANSETRON HCL 4 MG/2ML IJ SOLN
INTRAMUSCULAR | Status: DC | PRN
  Administered 2021-06-26: 4 mg via INTRAVENOUS

## 2021-06-26 MED ORDER — ACETAMINOPHEN 10 MG/ML IV SOLN
INTRAVENOUS | Status: AC
  Filled 2021-06-26: qty 100

## 2021-06-26 MED ORDER — LIDOCAINE-EPINEPHRINE 1 %-1:100000 IJ SOLN
INTRAMUSCULAR | Status: AC
  Filled 2021-06-26: qty 1

## 2021-06-26 MED ORDER — ONDANSETRON HCL 4 MG/2ML IJ SOLN
INTRAMUSCULAR | Status: AC
  Filled 2021-06-26: qty 2

## 2021-06-26 MED ORDER — ROCURONIUM BROMIDE 10 MG/ML (PF) SYRINGE
PREFILLED_SYRINGE | INTRAVENOUS | Status: DC | PRN
  Administered 2021-06-26: 50 mg via INTRAVENOUS
  Administered 2021-06-26: 20 mg via INTRAVENOUS

## 2021-06-26 MED ORDER — THROMBIN 5000 UNITS EX SOLR
OROMUCOSAL | Status: DC | PRN
  Administered 2021-06-26: 5 mL via TOPICAL

## 2021-06-26 MED ORDER — CHLORHEXIDINE GLUCONATE 0.12 % MT SOLN
OROMUCOSAL | Status: AC
  Administered 2021-06-26: 15 mL via OROMUCOSAL
  Filled 2021-06-26: qty 15

## 2021-06-26 MED ORDER — CHLORHEXIDINE GLUCONATE 0.12 % MT SOLN: 15.0000 mL | Freq: Once | OROMUCOSAL | Status: AC

## 2021-06-26 MED ORDER — OXYCODONE-ACETAMINOPHEN 5-325 MG PO TABS: 1.0000 | ORAL_TABLET | ORAL | 0 refills | Status: AC | PRN

## 2021-06-26 MED ORDER — FENTANYL CITRATE (PF) 100 MCG/2ML IJ SOLN
INTRAMUSCULAR | Status: AC
  Filled 2021-06-26: qty 2

## 2021-06-26 MED ORDER — ACETAMINOPHEN 10 MG/ML IV SOLN
1000.0000 mg | Freq: Once | INTRAVENOUS | Status: AC
  Administered 2021-06-26: 1000 mg via INTRAVENOUS

## 2021-06-26 MED ORDER — ROCURONIUM BROMIDE 10 MG/ML (PF) SYRINGE
PREFILLED_SYRINGE | INTRAVENOUS | Status: AC
  Filled 2021-06-26: qty 10

## 2021-06-26 MED ORDER — PHENYLEPHRINE HCL-NACL 20-0.9 MG/250ML-% IV SOLN
INTRAVENOUS | Status: DC | PRN
  Administered 2021-06-26: 40 ug/min via INTRAVENOUS

## 2021-06-26 MED ORDER — FENTANYL CITRATE (PF) 100 MCG/2ML IJ SOLN
25.0000 ug | INTRAMUSCULAR | Status: DC | PRN
  Administered 2021-06-26: 50 ug via INTRAVENOUS
  Administered 2021-06-26: 25 ug via INTRAVENOUS
  Administered 2021-06-26: 50 ug via INTRAVENOUS
  Administered 2021-06-26 (×2): 25 ug via INTRAVENOUS

## 2021-06-26 MED ORDER — 0.9 % SODIUM CHLORIDE (POUR BTL) OPTIME
TOPICAL | Status: DC | PRN
  Administered 2021-06-26: 1000 mL

## 2021-06-26 MED ORDER — LIDOCAINE-EPINEPHRINE 1 %-1:100000 IJ SOLN
INTRAMUSCULAR | Status: DC | PRN
  Administered 2021-06-26: 3 mL

## 2021-06-26 MED ORDER — OXYCODONE HCL 5 MG PO TABS
ORAL_TABLET | ORAL | Status: AC
  Filled 2021-06-26: qty 1

## 2021-06-26 MED ORDER — CHLORHEXIDINE GLUCONATE CLOTH 2 % EX PADS: 6.0000 | MEDICATED_PAD | Freq: Once | CUTANEOUS | Status: DC

## 2021-06-26 MED ORDER — LACTATED RINGERS IV SOLN: INTRAVENOUS | Status: DC

## 2021-06-26 MED ORDER — FENTANYL CITRATE (PF) 250 MCG/5ML IJ SOLN
INTRAMUSCULAR | Status: AC
  Filled 2021-06-26: qty 5

## 2021-06-26 MED ORDER — OXYCODONE HCL 5 MG PO TABS
5.0000 mg | ORAL_TABLET | Freq: Once | ORAL | Status: AC
  Administered 2021-06-26: 5 mg via ORAL

## 2021-06-26 MED ORDER — MUPIROCIN 2 % EX OINT
1.0000 "application " | TOPICAL_OINTMENT | Freq: Two times a day (BID) | CUTANEOUS | Status: DC
  Administered 2021-06-26: 1 via TOPICAL
  Filled 2021-06-26: qty 22

## 2021-06-26 MED ORDER — DOCUSATE SODIUM 100 MG PO CAPS: 100.0000 mg | ORAL_CAPSULE | Freq: Two times a day (BID) | ORAL | 2 refills | Status: AC

## 2021-06-26 MED ORDER — LIDOCAINE 2% (20 MG/ML) 5 ML SYRINGE
INTRAMUSCULAR | Status: DC | PRN
  Administered 2021-06-26: 60 mg via INTRAVENOUS

## 2021-06-26 SURGICAL SUPPLY — 69 items
BAG COUNTER SPONGE SURGICOUNT (BAG) ×2 IMPLANT
BAND RUBBER #18 3X1/16 STRL (MISCELLANEOUS) ×4 IMPLANT
BASKET BONE COLLECTION (BASKET) IMPLANT
BENZOIN TINCTURE PRP APPL 2/3 (GAUZE/BANDAGES/DRESSINGS) ×2 IMPLANT
BIT DRILL NEURO 2X3.1 SFT TUCH (MISCELLANEOUS) ×1 IMPLANT
BLADE CLIPPER SURG (BLADE) IMPLANT
BLADE SURG 15 STRL LF DISP TIS (BLADE) IMPLANT
BLADE SURG 15 STRL SS (BLADE)
BLADE ULTRA TIP 2M (BLADE) IMPLANT
BUR MATCHSTICK NEURO 3.0 LAGG (BURR) ×2 IMPLANT
CANISTER SUCT 3000ML PPV (MISCELLANEOUS) ×2 IMPLANT
COLLAR CERV LO CONTOUR FIRM DE (SOFTGOODS) ×2 IMPLANT
DECANTER SPIKE VIAL GLASS SM (MISCELLANEOUS) ×1 IMPLANT
DRAPE C-ARM 42X72 X-RAY (DRAPES) ×4 IMPLANT
DRAPE LAPAROTOMY 100X72 PEDS (DRAPES) ×2 IMPLANT
DRAPE MICROSCOPE LEICA (MISCELLANEOUS) ×2 IMPLANT
DRAPE SHEET LG 3/4 BI-LAMINATE (DRAPES) ×2 IMPLANT
DRESSING MEPILEX FLEX 4X4 (GAUZE/BANDAGES/DRESSINGS) ×1 IMPLANT
DRILL NEURO 2X3.1 SOFT TOUCH (MISCELLANEOUS) ×2
DRSG MEPILEX FLEX 4X4 (GAUZE/BANDAGES/DRESSINGS) ×2
DRSG OPSITE 4X5.5 SM (GAUZE/BANDAGES/DRESSINGS) ×4 IMPLANT
DRSG OPSITE POSTOP 3X4 (GAUZE/BANDAGES/DRESSINGS) ×2 IMPLANT
DURAPREP 26ML APPLICATOR (WOUND CARE) ×2 IMPLANT
ELECT COATED BLADE 2.86 ST (ELECTRODE) ×2 IMPLANT
ELECT REM PT RETURN 9FT ADLT (ELECTROSURGICAL) ×2
ELECTRODE REM PT RTRN 9FT ADLT (ELECTROSURGICAL) ×1 IMPLANT
GAUZE 4X4 16PLY ~~LOC~~+RFID DBL (SPONGE) IMPLANT
GLOVE BIOGEL PI IND STRL 7.5 (GLOVE) ×1 IMPLANT
GLOVE BIOGEL PI INDICATOR 7.5 (GLOVE) ×3
GLOVE ECLIPSE 6.5 STRL STRAW (GLOVE) ×1 IMPLANT
GLOVE ECLIPSE 7.0 STRL STRAW (GLOVE) ×1 IMPLANT
GLOVE ECLIPSE 7.5 STRL STRAW (GLOVE) ×2 IMPLANT
GLOVE SURG ENC MOIS LTX SZ8 (GLOVE) ×2 IMPLANT
GLOVE SURG UNDER POLY LF SZ8.5 (GLOVE) ×2 IMPLANT
GOWN STRL REUS W/ TWL LRG LVL3 (GOWN DISPOSABLE) ×2 IMPLANT
GOWN STRL REUS W/ TWL XL LVL3 (GOWN DISPOSABLE) ×2 IMPLANT
GOWN STRL REUS W/TWL 2XL LVL3 (GOWN DISPOSABLE) IMPLANT
GOWN STRL REUS W/TWL LRG LVL3 (GOWN DISPOSABLE) ×8
GOWN STRL REUS W/TWL XL LVL3 (GOWN DISPOSABLE) ×4
HEMOSTAT POWDER KIT SURGIFOAM (HEMOSTASIS) ×2 IMPLANT
KIT BASIN OR (CUSTOM PROCEDURE TRAY) ×2 IMPLANT
KIT TURNOVER KIT B (KITS) ×2 IMPLANT
NDL SPNL 22GX3.5 QUINCKE BK (NEEDLE) ×1 IMPLANT
NEEDLE HYPO 22GX1.5 SAFETY (NEEDLE) ×2 IMPLANT
NEEDLE SPNL 22GX3.5 QUINCKE BK (NEEDLE) ×2 IMPLANT
NS IRRIG 1000ML POUR BTL (IV SOLUTION) ×2 IMPLANT
PACK LAMINECTOMY NEURO (CUSTOM PROCEDURE TRAY) ×2 IMPLANT
PAD ARMBOARD 7.5X6 YLW CONV (MISCELLANEOUS) ×6 IMPLANT
PATTIES SURGICAL .5 X3 (DISPOSABLE) ×2 IMPLANT
PATTIES SURGICAL 1X1 (DISPOSABLE) ×1 IMPLANT
PIN DISTRACTION 14MM (PIN) ×4 IMPLANT
PLATE ANT CERV OZARK 22 1LVL (Plate) ×1 IMPLANT
PUTTY BONE 100 VESUVIUS 1CC (Putty) ×1 IMPLANT
SCREW VA ST OZARK 4X16 (Plate) ×1 IMPLANT
SPACER ANGLD CASCAD 16X13X7 7D (Spacer) ×1 IMPLANT
SPONGE INTESTINAL PEANUT (DISPOSABLE) ×2 IMPLANT
SPONGE SURGIFOAM ABS GEL SZ50 (HEMOSTASIS) IMPLANT
STAPLER VISISTAT 35W (STAPLE) IMPLANT
STRIP CLOSURE SKIN 1/2X4 (GAUZE/BANDAGES/DRESSINGS) ×2 IMPLANT
SUT MNCRL AB 4-0 PS2 18 (SUTURE) ×2 IMPLANT
SUT SILK 2 0 TIES 10X30 (SUTURE) IMPLANT
SUT VIC AB 0 CT1 27 (SUTURE)
SUT VIC AB 0 CT1 27XBRD ANTBC (SUTURE) IMPLANT
SUT VIC AB 2-0 CP2 18 (SUTURE) IMPLANT
SUT VIC AB 3-0 SH 8-18 (SUTURE) ×3 IMPLANT
TAPE CLOTH 3X10 TAN LF (GAUZE/BANDAGES/DRESSINGS) ×2 IMPLANT
TOWEL GREEN STERILE (TOWEL DISPOSABLE) ×2 IMPLANT
TOWEL GREEN STERILE FF (TOWEL DISPOSABLE) ×2 IMPLANT
WATER STERILE IRR 1000ML POUR (IV SOLUTION) ×2 IMPLANT

## 2021-06-26 NOTE — Anesthesia Postprocedure Evaluation (Signed)
Anesthesia Post Note ? ?Patient: Brett Landry ? ?Procedure(s) Performed: Anterior Cervical Decompression/Discectomy Fusion Cervical Five-Six ? ?  ? ?Patient location during evaluation: PACU ?Anesthesia Type: General ?Level of consciousness: awake ?Pain management: pain level controlled ?Vital Signs Assessment: post-procedure vital signs reviewed and stable ?Respiratory status: spontaneous breathing ?Cardiovascular status: stable ?Postop Assessment: no apparent nausea or vomiting ?Anesthetic complications: no ? ? ?No notable events documented. ? ?Last Vitals:  ?Vitals:  ? 06/26/21 1445 06/26/21 1500  ?BP: 110/75 115/76  ?Pulse: 76 72  ?Resp: 14 14  ?Temp:    ?SpO2: 93% 93%  ?  ?Last Pain:  ?Vitals:  ? 06/26/21 1440  ?PainSc: Asleep  ? ? ?  ?  ?  ?  ?  ?  ? ?Robbie Rideaux ? ? ? ? ?

## 2021-06-26 NOTE — Discharge Instructions (Addendum)
Can remove transparent dressing and shower 48 hours after surgery.  Leave steri-strips in place-- they will fall off on their own ?Walk as much as possible ?Wear soft collar for comfort ?No heavy lifting >10 lbs ?No excessive bending/twisting of the neck ?Take small bites of easy to swallow foods ?Resume aspirin on 4/21 ? ?

## 2021-06-26 NOTE — Anesthesia Procedure Notes (Signed)
Procedure Name: Intubation ?Date/Time: 06/26/2021 11:39 AM ?Performed by: Genelle Bal, CRNA ?Pre-anesthesia Checklist: Patient identified, Emergency Drugs available, Suction available and Patient being monitored ?Patient Re-evaluated:Patient Re-evaluated prior to induction ?Oxygen Delivery Method: Circle system utilized ?Preoxygenation: Pre-oxygenation with 100% oxygen ?Induction Type: IV induction ?Ventilation: Two handed mask ventilation required ?Laryngoscope Size: Sabra Heck and 2 ?Grade View: Grade I ?Tube type: Oral ?Tube size: 7.5 mm ?Number of attempts: 1 ?Airway Equipment and Method: Stylet and Oral airway ?Placement Confirmation: ETT inserted through vocal cords under direct vision, positive ETCO2 and breath sounds checked- equal and bilateral ?Secured at: 23 cm ?Tube secured with: Tape ?Dental Injury: Teeth and Oropharynx as per pre-operative assessment  ? ? ? ? ?

## 2021-06-26 NOTE — Progress Notes (Signed)
Patient was able to tolerated po liquids was able to void and ambulate from bathroom prior to discharge  ?

## 2021-06-26 NOTE — H&P (Signed)
CC: cervical myelopathy ? ?HPI: ? ?  ? ?Patient is a 76 y.o. male presents with cervical myelopathy.  He was found to have severe cervical stenosis. ? ? ? ?There are no problems to display for this patient. ? ?Past Medical History:  ?Diagnosis Date  ? Arthritis   ? Cancer [facility])   ? Prostate CA  ? Complication of anesthesia   ? BP blood "really low" per patient with prostate and cataract surgery  ? GERD (gastroesophageal reflux disease)   ? hx in past, per patient "no problems now", no meds  ? Headache   ? History of kidney stones   ? passed stones  ? HLD (hyperlipidemia)   ? tx w/Lipitor  ? Hypertension   ? Pre-diabetes   ? diet controlled, no meds  ?  ?Past Surgical History:  ?Procedure Laterality Date  ? CHOLECYSTECTOMY    ? COLONOSCOPY N/A 09/09/2014  ? Procedure: COLONOSCOPY;  Surgeon: Hulen Luster, MD;  Location: [facility] ENDOSCOPY;  Service: Gastroenterology;  Laterality: N/A;  ? EYE SURGERY Bilateral   ? remove cataracts  ? HERNIA REPAIR    ? Laparoscopic Robotic Removal of Prostate Gland  2010  ? at [facility]  ?  ?Medications Prior to Admission  ?Medication Sig Dispense Refill Last Dose  ? amLODipine (NORVASC) 5 MG tablet Take 5 mg by mouth daily.   06/25/2021  ? aspirin EC 81 MG tablet Take 81 mg by mouth daily.   06/18/2021  ? atorvastatin (LIPITOR) 20 MG tablet Take 20 mg by mouth daily.   06/25/2021  ? carboxymethylcellulose (REFRESH PLUS) 0.5 % SOLN Place 1 drop into both eyes 3 (three) times daily as needed (dry eyes).   06/25/2021  ? Coenzyme Q10 (CO Q-10 PO) Take 1 capsule by mouth daily.   Past Month  ? desonide (DESOWEN) 0.05 % cream Apply 1 application. topically daily as needed (dry skin).   Past Week  ? fluticasone (FLONASE) 50 MCG/ACT nasal spray Place 1 spray into both nostrils daily as needed for allergies or rhinitis.   Past Month  ? Lifitegrast (XIIDRA) 5 % SOLN Place 1 drop into both eyes in the morning.   06/25/2021  ? mometasone (ELOCON) 0.1 % cream Apply 1 application. 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   Past Month  ? mometasone (ELOCON) 0.1 % lotion Apply 1 application. topically 2 (two) times daily as needed (ear itching).   Past Month  ? Omega-3 Fatty Acids (FISH OIL PO) Take 1 capsule by mouth daily.   Past Month  ? ?Allergies  ?Allergen Reactions  ? Iodinated Contrast Media Itching  ?  Other reaction(s): Dizziness (intolerance)  ? Iodine Solution [Povidone Iodine] Other (See Comments)  ?  Pass out  ? Other   ?  Blood pressure drops very low with anesthesia, needs to have fluids ran prior to anesthesia  ?  ?Social History  ? ?Tobacco Use  ? Smoking status: Former  ?  Packs/day: 0.75  ?  Years: 15.00  ?  Pack years: 11.25  ?  Types: Cigarettes  ?  Quit date: 57  ?  Years since quitting: 33.3  ? Smokeless tobacco: Never  ?Substance Use Topics  ? Alcohol use: No  ?  ?History reviewed. No pertinent family history.  ? ?Review of Systems ?Pertinent items noted in HPI and remainder of comprehensive ROS otherwise negative. ? ?Objective:  ? ?Patient Vitals for the past 8 hrs: ? BP Temp Pulse Resp SpO2  Height Weight  ?06/26/21 0758 (!) 130/93 97.8 ?F (36.6 ?C) 75 18 94 % 5' 10.5" (1.791 m) 97.5 kg  ? ?No intake/output data recorded. ?No intake/output data recorded. ? ? ? ? ? General : Alert, cooperative, no distress, appears stated age  ? Head:  Normocephalic/atraumatic   ? Eyes: PERRL, conjunctiva/corneas clear, EOM's intact. Fundi could not be visualized ?Neck: Supple ?Chest:  Respirations unlabored ?Chest wall: no tenderness or deformity ?Heart: Regular rate and rhythm ?Abdomen: Soft, nontender and nondistended ?Extremities: warm and well-perfused ?Skin: normal turgor, color and texture ?Neurologic:  Alert, oriented x 3.  Eyes open spontaneously. PERRL, EOMI, VFC, no facial droop. V1-3 intact.  No dysarthria, tongue protrusion symmetric.  CNII-XII intact. 4/5 left hand grip, otw 5/5.  + left Spurling's  ?  ? ? ? ?Data Review: ?Severe cervical stenosis at C5-6 with cord impingement, cord  signal change ? ?Assessment:  ? ?Severe cervical stenosis with myelopathy ? ?Plan:  ? ?- C5-6 ACDF today ?- risks, benefits, alternatives, and expected convalescence were discussed.  Risks discussed included, but were not limited to, bleeding, pain, infection, scar, stroke, dysphagia, dysphonia, neurologic deficit, pseudoarthrosis, adjacent segment disease, death.  Informed consent was obtained. ? ? ?

## 2021-06-26 NOTE — Op Note (Signed)
PREOP DIAGNOSIS: cervical myelopathy ? ?POSTOP DIAGNOSIS: cervical myelopathy ? ? ?PROCEDURE: ?1. Arthrodesis C5-6, anterior interbody technique, including Discectomy for decompression of spinal cord and exiting nerve roots with foraminotomies  ?2. Placement of intervertebral biomechanical device C5-6 ?3. Placement of anterior instrumentation consisting of interbody plate and screws X4-4 ?4. Use of morselized bone allograft  ?5. Use of intraoperative microscope ? ?SURGEON: Dr. Duffy Rhody, MD ? ?ASSISTANT:  ?Consuella Lose, MD.  Please note, no qualified trainees were available to assist with the procedure.  Assistance was required for retraction of the visceral structures to safely allow for instrumentation. ?2.   Weston Brass, NP. ? ?ANESTHESIA: General Endotracheal ? ?EBL: 25 ml ? ?IMPLANTS: ?Stryker ?7 mm large [place] C implant ?20 mm [place]ark plate ?4.0 x 16 mm screws x 4 ? ?SPECIMENS: None ? ?DRAINS: None ? ?COMPLICATIONS: None immediate ? ?CONDITION: Hemodynamically stable to PACU ? ?HISTORY: ?Brett Landry is a 76 y.o. y.o. male  who presented with cervical myelopathy and was found to have severe cervical stenosis with cord signal change at C5-6.  Given his progressive symptoms and severe cord impingement from large disc-osteophyte, I recommended C5-6 ACDF. Risks, benefits, alternatives, and expected convalescence were discussed with the patient.  Risks discussed included but were not limited to bleeding, pain, infection, pseudoarthrosis, hardware failure, adjacent segment disease, CSF leak, neurologic deficits, weakness, numbness, paralysis, coma, and death. After all questions were answered, informed consent was obtained. ? ?PROCEDURE IN DETAIL: ?The patient was brought to the operating room and transferred to the operative table. After induction of general anesthesia, the patient was positioned on the operative table in the supine position with all pressure points meticulously padded. The skin of  the neck was then prepped and draped in the usual sterile fashion. ? ?After timeout was conducted, the skin was infiltrated with local anesthetic. Skin incision was then made sharply and Bovie electrocautery was used to dissect the subcutaneous tissue until the platysma was identified. The platysma was then divided and undermined. The sternocleidomastoid muscle was then identified and, utilizing natural fascial planes in the neck, the prevertebral fascia was identified and the carotid sheath was retracted laterally and the trachea and esophagus retracted medially. Again using fluoroscopy, the C5-6 disc space was identified. Bovie electrocautery was used to dissect in the subperiosteal plane and elevate the bilateral longus coli muscles. Large anterior osteophytes were removed.  Self-retaining retractors were then placed. Caspar distraction pins were placed in the adjacent bodies to allow for gentle distraction.  At this point, the microscope was draped and brought into the field, and the remainder of the case was done under the microscope using microdissecting technique. ? ?The disc space was incised sharply and combination of high speed drill, curettes, and rongeurs were use to initially complete a discectomy. The high-speed drill was then used to complete discectomy until the posterior annulus was identified and removed and the posterior longitudinal ligament was identified. Using a nerve hook, the PLL was elevated, and Kerrison rongeurs were used to remove the posterior longitudinal ligament and the ventral thecal sac was identified. Using a combination of curettes and rongeurs, complete decompression of the thecal sac and exiting nerve roots at this level was completed, and verified with easy passage of micro-nerve hook centrally and in the bilateral foramina. ? ?Having completed our decompression, attention was turned to placement of the intervertebral device. Trial spacers were used to select a size 7 mm  graft. This graft was then filled with morcellized allograft, and  inserted under live fluoroscopy. ? ?After placement of the intervertebral device, the caspar pins were removed.  An anterior cervical plate was placed across the interspaces for anterior fixation.  Using a high-speed drill, the cortex of the cervical vertebral bodies was punctured, and screws inserted in the vertebral bodies. Final fluoroscopic images in AP and lateral projections were taken to confirm good hardware placement. ? ?At this point, after all counts were verified to be correct, meticulous hemostasis was secured using a combination of bipolar electrocautery and passive hemostatics. The platysma muscle was then closed using interrupted 3-0 Vicryl sutures, and the skin was closed with a 4-0 monocryl in subcutical fashion. Sterile dressings were then applied and the drapes removed. ? ?The patient tolerated the procedure well and was extubated in the room and taken to the postanesthesia care unit in stable condition.  All counts were correct at the end of the procedure. ? ?

## 2021-06-26 NOTE — Transfer of Care (Signed)
Immediate Anesthesia Transfer of Care Note ? ?Patient: Brett Landry ? ?Procedure(s) Performed: Anterior Cervical Decompression/Discectomy Fusion Cervical Five-Six ? ?Patient Location: PACU ? ?Anesthesia Type:General ? ?Level of Consciousness: awake, alert  and oriented ? ?Airway & Oxygen Therapy: Patient Spontanous Breathing and Patient connected to face mask oxygen ? ?Post-op Assessment: Report given to RN and Post -op Vital signs reviewed and stable ? ?Post vital signs: Reviewed and stable ? ?Last Vitals:  ?Vitals Value Taken Time  ?BP 134/88 06/26/21 1340  ?Temp    ?Pulse 83 06/26/21 1342  ?Resp 15 06/26/21 1342  ?SpO2 97 % 06/26/21 1342  ?Vitals shown include unvalidated device data. ? ?Last Pain:  ?Vitals:  ? 06/26/21 0819  ?PainSc: 7   ?   ? ?  ? ?Complications: No notable events documented. ?

## 2021-06-28 ENCOUNTER — Encounter (HOSPITAL_COMMUNITY): Payer: Self-pay | Admitting: Neurosurgery

## 2022-08-06 IMAGING — CR DG CERVICAL SPINE 1V
1 series · 1 of 1 positions shown · non-contrast
Comparison: None.

CLINICAL DATA: Evaluate for retained foreign body

EXAM:
DG CERVICAL SPINE - 1 VIEW

[xtable]
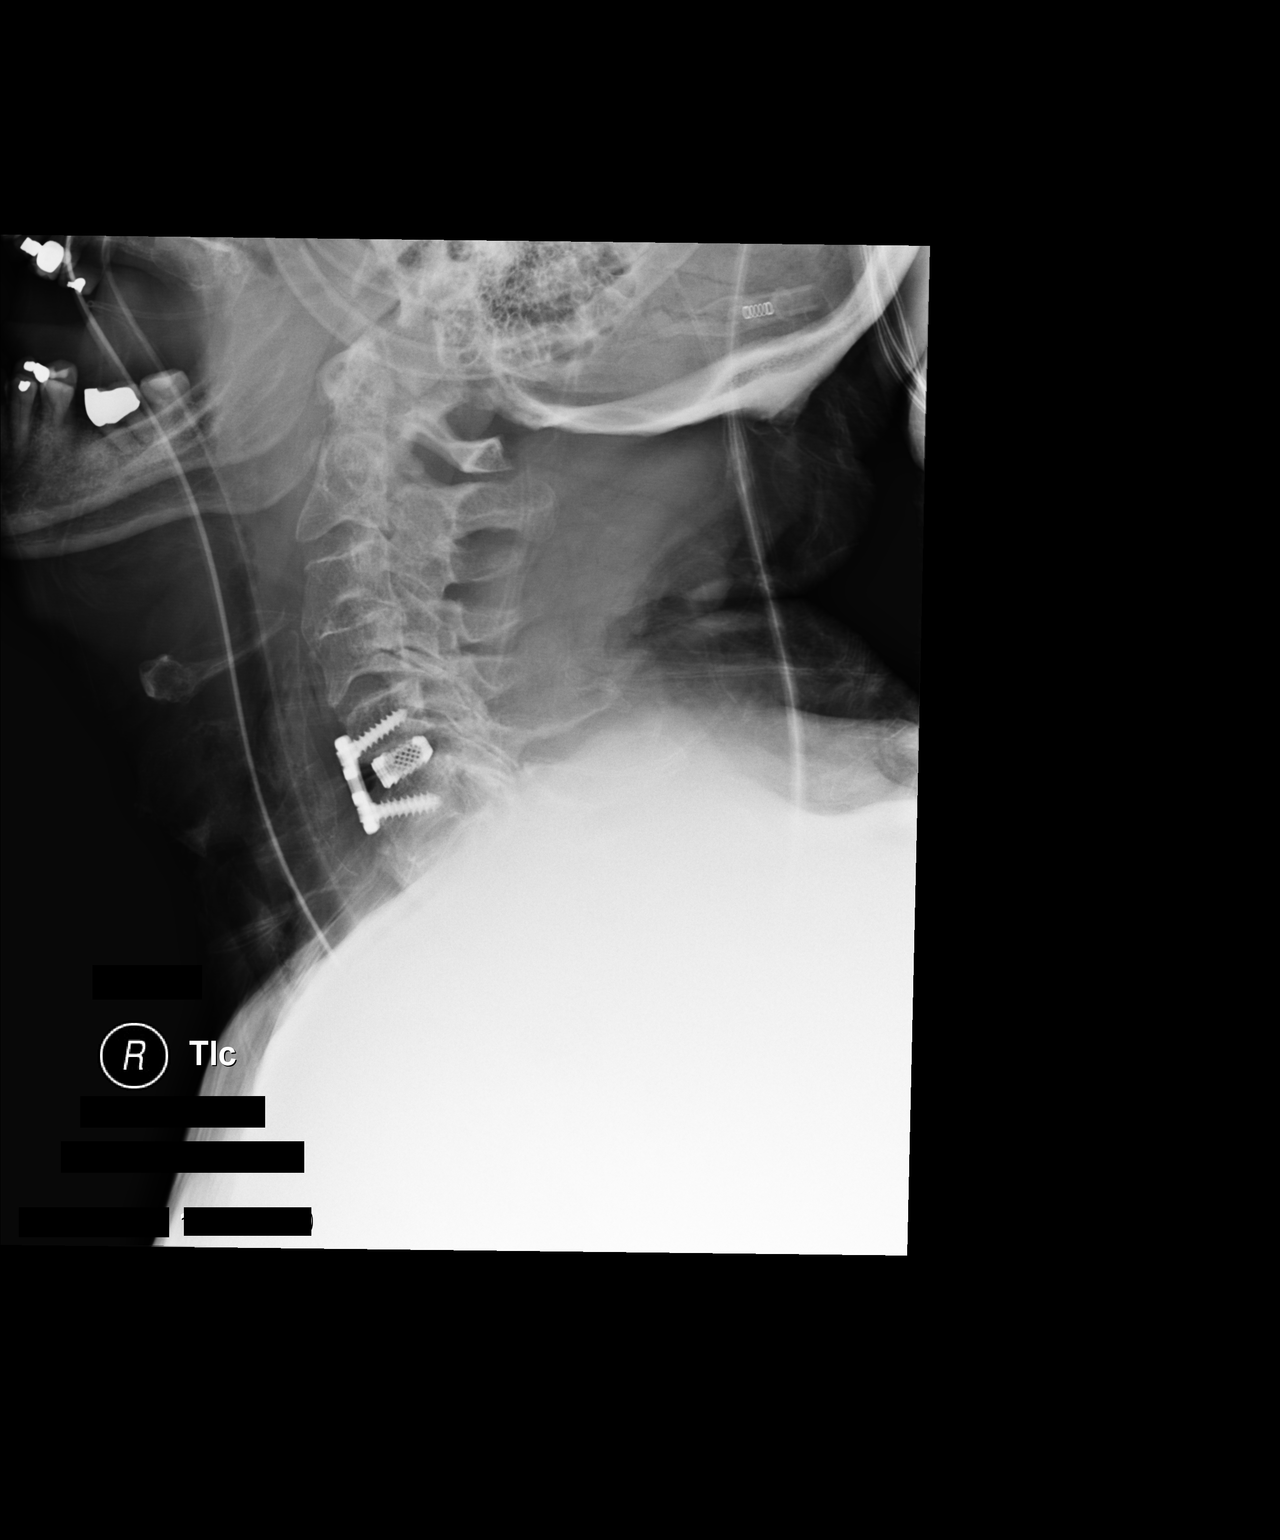

[1 of 1 positions shown; findings below may reference images not displayed]

FINDINGS: There is evidence of anterior cervical disc fusion at C5-C6 level.
Intervertebral disc spacer is noted. Endotracheal tube is noted. No
other definite opaque foreign bodies are seen.
IMPRESSION: Status post anterior cervical disc fusion at C5-C6 level. No
definite opaque foreign body is seen.

Imaging findings were relayed to provider Harold in OR by telephone
call.

## 2023-03-24 DIAGNOSIS — K080 Exfoliation of teeth due to systemic causes: Secondary | ICD-10-CM | POA: Diagnosis not present

## 2023-03-31 DIAGNOSIS — L400 Psoriasis vulgaris: Secondary | ICD-10-CM | POA: Diagnosis not present

## 2023-04-15 ENCOUNTER — Encounter (HOSPITAL_COMMUNITY): Payer: Self-pay | Admitting: Specialist

## 2023-04-16 ENCOUNTER — Encounter (HOSPITAL_BASED_OUTPATIENT_CLINIC_OR_DEPARTMENT_OTHER): Payer: Self-pay | Admitting: Specialist

## 2023-04-16 ENCOUNTER — Encounter (HOSPITAL_COMMUNITY): Payer: Self-pay | Admitting: Specialist

## 2023-04-16 DIAGNOSIS — N402 Nodular prostate without lower urinary tract symptoms: Secondary | ICD-10-CM

## 2023-04-17 ENCOUNTER — Other Ambulatory Visit (HOSPITAL_COMMUNITY): Payer: Self-pay | Admitting: Specialist

## 2023-04-17 ENCOUNTER — Encounter: Payer: Self-pay | Admitting: Specialist

## 2023-04-17 DIAGNOSIS — N402 Nodular prostate without lower urinary tract symptoms: Secondary | ICD-10-CM

## 2023-04-28 ENCOUNTER — Encounter (HOSPITAL_COMMUNITY)
Admission: RE | Admit: 2023-04-28 | Discharge: 2023-04-28 | Disposition: A | Discharge status: Home / Self Care | Payer: Non-veteran care | Source: Ambulatory Visit | Attending: Specialist | Admitting: Specialist

## 2023-04-28 DIAGNOSIS — N402 Nodular prostate without lower urinary tract symptoms: Secondary | ICD-10-CM | POA: Insufficient documentation

## 2023-04-28 MED ORDER — FLOTUFOLASTAT F 18 GALLIUM 296-5846 MBQ/ML IV SOLN
8.0000 | Freq: Once | INTRAVENOUS | Status: AC
  Administered 2023-04-28: 8 via INTRAVENOUS
  Filled 2023-04-28: qty 8

## 2023-09-08 ENCOUNTER — Other Ambulatory Visit: Payer: Self-pay | Admitting: Medical

## 2023-09-08 DIAGNOSIS — E079 Disorder of thyroid, unspecified: Secondary | ICD-10-CM

## 2023-09-15 ENCOUNTER — Ambulatory Visit
Admission: RE | Admit: 2023-09-15 | Discharge: 2023-09-15 | Disposition: A | Discharge status: Home / Self Care | Source: Ambulatory Visit | Attending: Medical | Admitting: Medical

## 2023-09-15 DIAGNOSIS — E079 Disorder of thyroid, unspecified: Secondary | ICD-10-CM | POA: Insufficient documentation

## 2023-09-22 DIAGNOSIS — K080 Exfoliation of teeth due to systemic causes: Secondary | ICD-10-CM | POA: Diagnosis not present

## 2023-10-02 ENCOUNTER — Other Ambulatory Visit: Payer: Self-pay | Admitting: Student

## 2023-10-02 DIAGNOSIS — C61 Malignant neoplasm of prostate: Secondary | ICD-10-CM

## 2023-12-08 ENCOUNTER — Ambulatory Visit
Admission: RE | Admit: 2023-12-08 | Discharge: 2023-12-08 | Disposition: A | Discharge status: Home / Self Care | Source: Ambulatory Visit | Attending: Student | Admitting: Student

## 2023-12-08 DIAGNOSIS — C7951 Secondary malignant neoplasm of bone: Secondary | ICD-10-CM | POA: Insufficient documentation

## 2023-12-08 DIAGNOSIS — Z923 Personal history of irradiation: Secondary | ICD-10-CM | POA: Insufficient documentation

## 2023-12-08 DIAGNOSIS — C61 Malignant neoplasm of prostate: Secondary | ICD-10-CM | POA: Diagnosis present

## 2023-12-08 DIAGNOSIS — C779 Secondary and unspecified malignant neoplasm of lymph node, unspecified: Secondary | ICD-10-CM | POA: Insufficient documentation

## 2023-12-08 MED ORDER — FLOTUFOLASTAT F 18 GALLIUM 296-5846 MBQ/ML IV SOLN
8.7900 | Freq: Once | INTRAVENOUS | Status: AC
  Administered 2023-12-08: 8.79 via INTRAVENOUS
  Filled 2023-12-08: qty 9

## 2023-12-31 DIAGNOSIS — D440 Neoplasm of uncertain behavior of thyroid gland: Secondary | ICD-10-CM | POA: Diagnosis not present

## 2023-12-31 DIAGNOSIS — E041 Nontoxic single thyroid nodule: Secondary | ICD-10-CM | POA: Diagnosis not present

## 2023-12-31 DIAGNOSIS — E042 Nontoxic multinodular goiter: Secondary | ICD-10-CM | POA: Diagnosis not present

## 2023-12-31 DIAGNOSIS — E079 Disorder of thyroid, unspecified: Secondary | ICD-10-CM | POA: Diagnosis not present

## 2023-12-31 DIAGNOSIS — E89820 Postprocedural hematoma of an endocrine system organ or structure following an endocrine system procedure: Secondary | ICD-10-CM | POA: Diagnosis not present
